# Patient Record
Sex: Male | Born: 1995 | Race: White | Hispanic: No | Marital: Single | Smoking: Never smoker
Health system: Southern US, Community
[De-identification: ages and names within clinical notes are randomized; demographics above are authoritative.]

## PROBLEM LIST (undated history)

## (undated) DIAGNOSIS — F840 Autistic disorder: Secondary | ICD-10-CM

## (undated) DIAGNOSIS — F79 Unspecified intellectual disabilities: Secondary | ICD-10-CM

## (undated) DIAGNOSIS — R569 Unspecified convulsions: Secondary | ICD-10-CM

## (undated) HISTORY — PX: TYMPANOSTOMY TUBE PLACEMENT: SHX32

## (undated) HISTORY — PX: TONSILLECTOMY: SUR1361

---
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
## 2015-02-18 ENCOUNTER — Emergency Department
Admission: EM | Admit: 2015-02-18 | Discharge: 2015-02-18 | Disposition: A | Discharge status: Home / Self Care | Payer: Medicaid Other | Attending: Emergency Medicine | Admitting: Emergency Medicine

## 2015-02-18 ENCOUNTER — Encounter: Payer: Self-pay | Admitting: Emergency Medicine

## 2015-02-18 DIAGNOSIS — F840 Autistic disorder: Secondary | ICD-10-CM | POA: Insufficient documentation

## 2015-02-18 DIAGNOSIS — Y9389 Activity, other specified: Secondary | ICD-10-CM | POA: Insufficient documentation

## 2015-02-18 DIAGNOSIS — R569 Unspecified convulsions: Secondary | ICD-10-CM | POA: Insufficient documentation

## 2015-02-18 DIAGNOSIS — Y9289 Other specified places as the place of occurrence of the external cause: Secondary | ICD-10-CM | POA: Insufficient documentation

## 2015-02-18 DIAGNOSIS — S01552A Open bite of oral cavity, initial encounter: Secondary | ICD-10-CM | POA: Insufficient documentation

## 2015-02-18 DIAGNOSIS — X58XXXA Exposure to other specified factors, initial encounter: Secondary | ICD-10-CM | POA: Insufficient documentation

## 2015-02-18 DIAGNOSIS — Y998 Other external cause status: Secondary | ICD-10-CM | POA: Diagnosis not present

## 2015-02-18 HISTORY — DX: Unspecified intellectual disabilities: F79

## 2015-02-18 HISTORY — DX: Unspecified convulsions: R56.9

## 2015-02-18 HISTORY — DX: Autistic disorder: F84.0

## 2015-02-18 LAB — COMPREHENSIVE METABOLIC PANEL
ALK PHOS: 67 U/L (ref 38–126)
ALT: 110 U/L — ABNORMAL HIGH (ref 17–63)
ANION GAP: 8 (ref 5–15)
AST: 48 U/L — ABNORMAL HIGH (ref 15–41)
Albumin: 4.7 g/dL (ref 3.5–5.0)
BUN: 19 mg/dL (ref 6–20)
CALCIUM: 9.6 mg/dL (ref 8.9–10.3)
CO2: 29 mmol/L (ref 22–32)
CREATININE: 0.78 mg/dL (ref 0.61–1.24)
Chloride: 101 mmol/L (ref 101–111)
GFR calc non Af Amer: >60 mL/min (ref >60)
GLUCOSE: 129 mg/dL — AB (ref 65–99)
Potassium: 3.9 mmol/L (ref 3.5–5.1)
SODIUM: 138 mmol/L (ref 135–145)
Total Bilirubin: 0.2 mg/dL — ABNORMAL LOW (ref 0.3–1.2)
Total Protein: 7.8 g/dL (ref 6.5–8.1)

## 2015-02-18 LAB — CBC WITH DIFFERENTIAL/PLATELET
Basophils Absolute: 0 10*3/uL (ref 0–0.1)
Basophils Relative: 0 %
EOS ABS: 0 10*3/uL (ref 0–0.7)
EOS PCT: 0 %
HCT: 44.9 % (ref 40.0–52.0)
HEMOGLOBIN: 15.5 g/dL (ref 13.0–18.0)
LYMPHS ABS: 1.2 10*3/uL (ref 1.0–3.6)
Lymphocytes Relative: 13 %
MCH: 29.9 pg (ref 26.0–34.0)
MCHC: 34.7 g/dL (ref 32.0–36.0)
MCV: 86.3 fL (ref 80.0–100.0)
MONO ABS: 0.5 10*3/uL (ref 0.2–1.0)
MONOS PCT: 6 %
NEUTROS PCT: 81 %
Neutro Abs: 7 10*3/uL — ABNORMAL HIGH (ref 1.4–6.5)
Platelets: 248 10*3/uL (ref 150–440)
RBC: 5.2 MIL/uL (ref 4.40–5.90)
RDW: 13.8 % (ref 11.5–14.5)
WBC: 8.8 10*3/uL (ref 3.8–10.6)

## 2015-02-18 MED ORDER — AZITHROMYCIN 250 MG PO TABS
ORAL_TABLET | ORAL | Status: AC
  Filled 2015-02-18: qty 4

## 2015-02-18 MED ORDER — SODIUM CHLORIDE 0.9 % IV BOLUS (SEPSIS)
1000.0000 mL | Freq: Once | INTRAVENOUS | Status: AC
  Administered 2015-02-18: 1000 mL via INTRAVENOUS

## 2015-02-18 MED ORDER — SODIUM CHLORIDE 0.9 % IV SOLN
1000.0000 mg | Freq: Once | INTRAVENOUS | Status: AC
  Administered 2015-02-18: 1000 mg via INTRAVENOUS
  Filled 2015-02-18: qty 10

## 2015-02-18 MED ORDER — LEVETIRACETAM 100 MG/ML PO SOLN: 750.0000 mg | Freq: Two times a day (BID) | ORAL | Status: AC

## 2015-02-18 MED ORDER — CHLORHEXIDINE GLUCONATE 0.12 % MT SOLN: 15.0000 mL | Freq: Two times a day (BID) | OROMUCOSAL | Status: AC

## 2015-02-18 MED ORDER — LEVETIRACETAM 750 MG PO TABS: 750.0000 mg | ORAL_TABLET | Freq: Two times a day (BID) | ORAL | Status: AC

## 2015-02-18 MED ORDER — LORAZEPAM 2 MG/ML IJ SOLN
2.0000 mg | Freq: Once | INTRAMUSCULAR | Status: AC
  Administered 2015-02-18: 2 mg via INTRAVENOUS

## 2015-02-18 MED ORDER — CEFTRIAXONE SODIUM 250 MG IJ SOLR
INTRAMUSCULAR | Status: AC
  Filled 2015-02-18: qty 250

## 2015-02-18 MED ORDER — LORAZEPAM 2 MG/ML IJ SOLN
INTRAMUSCULAR | Status: AC
  Administered 2015-02-18: 2 mg via INTRAVENOUS
  Filled 2015-02-18: qty 1

## 2015-02-18 NOTE — ED Notes (Signed)
Per dr. Alphonzo LemmingsMcshane request, attempted to ambulate pt.  Pt assisted to sitting position on bed and HR increased to 148.  Pt allowed to sit for a few minutes and HR did not decrease.  Pt not ambulated.  Dr. Alphonzo LemmingsMcshane informed.

## 2015-02-18 NOTE — ED Provider Notes (Addendum)
[facility] Emergency Department Provider Note  ____________________________________________   I have reviewed the triage vital signs and the nursing notes.   HISTORY  Chief Complaint Seizures    HPI Sean Morrow is a 19 y.o. male presents today after having had a witnessed seizure. Patient has a history of autism millimeters high functioning able to speak, moderate MR according to notes. 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 He was on phenobarbital approximately 5 years ago. The family has not followed up with neurology since that time. There is some limited means in the family. They cannot make it to [facility]. The patient has had 3 seizures since August including this one. They're grand mal seizures they have been witnessed that may have been others of the did not witness. The patient is able to tell me that he is not suffering from any pain right now and he feels okay. He is at his baseline per family. He is not taking any medication has no allergies to any medication and is not currently on any antiepileptic management.  Past Medical History  Diagnosis Date  . Mental retardation   . Autism   . Seizures (HCC)     There are no active problems to display for this patient.   Past Surgical History  Procedure Laterality Date  . Tonsillectomy    . Tympanostomy tube placement      No current outpatient prescriptions on file.  Allergies Citrus  History reviewed. No pertinent family history.  Social History Social History  Substance Use Topics  . Smoking status: Never Smoker   . Smokeless tobacco: Never Used  . Alcohol Use: No    Review of Systems, somewhat limited secondary to patient baseline mental status Constitutional: No fever/chills Eyes: No visual changes. ENT: No sore throat. No stiff neck  no neck pain Cardiovascular: Denies chest pain. Respiratory: Denies shortness of breath. Gastrointestinal:   no vomiting.  No diarrhea.  No constipation. Genitourinary: Negative for dysuria. Musculoskeletal: Negative lower extremity swelling Skin: Negative for rash. Neurological: Negative for headaches, focal weakness or numbness. 10-point ROS otherwise negative.  ____________________________________________   PHYSICAL EXAM:  VITAL SIGNS: ED Triage Vitals  Enc Vitals Group     BP 02/18/15 1603 148/75 mmHg     Pulse Rate 02/18/15 1603 99     Resp 02/18/15 1603 16     Temp 02/18/15 1603 97.9 F (36.6 C)     Temp Source 02/18/15 1603 Oral     SpO2 02/18/15 1601 95 %     Weight 02/18/15 1603 224 lb (101.606 kg)     Height 02/18/15 1603  (1.676 m)     Head Cir --      Peak Flow --      Pain Score --      Pain Loc --      Pain Edu? --      Excl. in GC? --     Constitutional: Alert and oriented to name and place unsure of the date has autism but will talk to me.. Well appearing and in no acute distress. Eyes: Conjunctivae are normal. PERRL. EOMI. Head: Atraumatic. Nose: No congestion/rhinnorhea. Mouth/Throat: Mucous membranes are moist.  Oropharynx non-erythematous. There is a well approximated bite to the right side of the tongue which will not require sutures Neck: No stridor.  Nontender with no meningismus Cardiovascular: Normal rate, regular rhythm. Grossly normal heart sounds.  Good peripheral circulation. Respiratory: Normal respiratory effort.  No retractions. Lungs CTAB. Abdominal: Soft and nontender. No distention. No guarding no rebound Back:  There is no focal tenderness or step off there is no midline tenderness there are no lesions noted. there is no CVA tenderness  Musculoskeletal: No lower extremity tenderness. No joint effusions, no DVT signs strong distal pulses no edema Neurologic:  Normal speech and language. No gross focal neurologic deficits are  appreciated.  Skin:  Skin is warm, dry and intact. No rash noted. Psychiatric: Mood and affect are normal. Speech and behavior are normal.  ____________________________________________   LABS (all labs ordered are listed, but only abnormal results are displayed)  Labs Reviewed  CBC WITH DIFFERENTIAL/PLATELET  COMPREHENSIVE METABOLIC PANEL   ____________________________________________  EKG  I personally interpreted any EKGs ordered by me or triage Sinus tachycardia rate 105 bpm no acute ST elevation or depression QTc 446 ____________________________________________  RADIOLOGY  I reviewed any imaging ordered by me or triage that were performed during my shift ____________________________________________   PROCEDURES  Procedure(s) performed: None  Critical Care performed: None  ____________________________________________   INITIAL IMPRESSION / ASSESSMENT AND PLAN / ED COURSE  Pertinent labs & imaging results that were available during my care of the patient were reviewed by me and considered in my medical decision making (see chart for details).  Patient had a grand mal seizure today. I did talk to Dr. Katrinka Blazing of neurology. He advises that we start the patient on Keppra, loading him at a gram IV and then starting at 750 twice a day. We have placed the patient under observation with seizure pads in the meantime. I will check basic blood work although I anticipate will not be illuminating. On-call neurology recommends the patient follow up in a few weeks with them in their clinic.  ----------------------------------------- 8:20 PM on 02/18/2015 -----------------------------------------  Patient had a witnessed tonic clonic seizure here, lasted for less than 2 minutes, this was before we were able to load him with Keppra. He has not been loaded with Keppra. He is resting comfortably. He did bite his tongue. There is no evidence of airway compromise. The patient is in no acute  distress, he does rouse and tell me his name this time. He did receive Ativan during seizure. We'll watch him until he is back to baseline and send him home with Her if we can. Had the patient seized after Keppra think it would be more likely that he'll benefit from admission but now that he is loaded, I think his likelihood of seizure should be greatly decreased. Mother has been advised that if he does have repeat seizures or he is deteriorating in any way he needs to return to the emergency department.   ----------------------------------------- 11:04 PM on 02/18/2015 -----------------------------------------  Patient did bite his tongue but it does not require repair. The patient is awake and alert, heart rate is in the 80s when he is calm, he is awake and oriented I do not think he requires admission. He had seizures without being on seizure medication after a long history of seizure disorder. I have discussed with neurology who will follow-up. I have given him a Low-dose he has been observed here in the emergency room for multiple hours. There was a few moments when his heart rate would go out the patient is very anxious sometimes his heart rate would go up when I talked  to him, he does have a history of autism however when he is resting calmly he is heart rate is in the 80s and he is not orthostatic has not dropped his pressure and I do not think that this is something contribute to his particular ailments. Given his autism I do think he would do better at home and the family agrees. We'll therefore discharge him with return precautions for seizure as well as a prescription for 750 twice a day of Keflex as indicated by neurology. ____________________________________________   FINAL CLINICAL IMPRESSION(S) / ED DIAGNOSES  Final diagnoses:  None     Jeanmarie PlantJames A Christifer Chapdelaine, MD 02/18/15 1616  Jeanmarie PlantJames A Natsha Guidry, MD 02/18/15 [id number]  Jeanmarie PlantJames A Rhylen Pulido, MD 02/18/15 [id number]  Jeanmarie PlantJames A Babbette Dalesandro, MD 02/18/15  [id number]

## 2015-02-18 NOTE — ED Notes (Signed)
Per pt's mother, pt keep removing [place].  Pt off [place] for several minutes, maintaining o2sat in mid to upper 90s.  Pt asleep.

## 2015-02-18 NOTE — ED Notes (Signed)
Pt began having a seizure at 1750, full tonic clonic seizure noted with patient biting his tongue. MD called to bedside. Seizure last approx 1.5 mins at this time.

## 2015-02-18 NOTE — ED Notes (Signed)
Per EMS pt had a witness seizure at school that was a full tonic-clonic. Pt has hx of seizures, autisms, and mild MR. EMS also states that at the school the teacher whose classroom he was in started 4-5 compressions when she believed him to be in cardiac arrest. Pt also presents with a bite to the R side of his tongue.

## 2015-02-18 NOTE — Discharge Instructions (Signed)
Seizure, Adult A seizure is abnormal electrical activity in the brain. Seizures usually last from 30 seconds to 2 minutes. There are various types of seizures. Before a seizure, you may have a warning sensation (aura) that a seizure is about to occur. An aura may include the following symptoms:   Fear or anxiety.  Nausea.  Feeling like the room is spinning (vertigo).  Vision changes, such as seeing flashing lights or spots. Common symptoms during a seizure include:  A change in attention or behavior (altered mental status).  Convulsions with rhythmic jerking movements.  Drooling.  Rapid eye movements.  Grunting.  Loss of bladder and bowel control.  Bitter taste in the mouth.  Tongue biting. After a seizure, you may feel confused and sleepy. You may also have an injury resulting from convulsions during the seizure. HOME CARE INSTRUCTIONS   If you are given medicines, take them exactly as prescribed by your health care provider.  Keep all follow-up appointments as directed by your health care provider.  Do not swim or drive or engage in risky activity during which a seizure could cause further injury to you or others until your health care provider says it is OK.  Get adequate rest.  Teach friends and family what to do if you have a seizure. They should:  Lay you on the ground to prevent a fall.  Put a cushion under your head.  Loosen any tight clothing around your neck.  Turn you on your side. If vomiting occurs, this helps keep your airway clear.  Stay with you until you recover.  Know whether or not you need emergency care. SEEK IMMEDIATE MEDICAL CARE IF:  The seizure lasts longer than 5 minutes.  The seizure is severe or you do not wake up immediately after the seizure.  You have an altered mental status after the seizure.  You are having more frequent or worsening seizures. Someone should drive you to the emergency department or call local emergency  services (911 in [place] MAKE SURE YOU:  Understand these instructions.  Will watch your condition.  Will get help right away if you are not doing well or get worse.   This information is not intended to replace advice given to you by your health care provider. Make sure you discuss any questions you have with your health care provider.   Document Released: 03/04/2000 Document Revised: 03/28/2014 Document Reviewed: 10/17/2012 Elsevier Interactive Patient Education 2016 [employer].  Epilepsy People with epilepsy have times when they shake and jerk uncontrollably (seizures). This happens when there is a sudden change in brain function. Epilepsy may have many possible causes. Anything that disturbs the normal pattern of brain cell activity can lead to seizures. HOME CARE   Follow your doctor's instructions about driving and safety during normal activities.  Get enough sleep.  Only take medicine as told by your doctor.  Avoid things that you know can cause you to have seizures (triggers).  Write down when your seizures happen and what you remember about each seizure. Write down anything you think may have caused the seizure to happen.  Tell the people you live and work with that you have seizures. Make sure they know how to help you. They should:  Cushion your head and body.  Turn you on your side.  Not restrain you.  Not place anything inside your mouth.  Call for local emergency medical help if there is any question about what has happened.  Keep all follow-up visits  with your doctor. This is very important. GET HELP IF:  You get an infection or start to feel sick. You may have more seizures when you are sick.  You are having seizures more often.  Your seizure pattern is changing. GET HELP RIGHT AWAY IF:   A seizure does not stop after a few seconds or minutes.  A seizure causes you to have trouble breathing.  A seizure gives you a very bad headache.  A seizure  makes you unable to speak or use a part of your body.   This information is not intended to replace advice given to you by your health care provider. Make sure you discuss any questions you have with your health care provider.   Document Released: 01/02/2009 Document Revised: 12/26/2012 Document Reviewed: 10/17/2012 Elsevier Interactive Patient Education [employer].

## 2015-02-18 NOTE — ED Notes (Signed)
Pt assisted to seated position, HR increased to 140.  After few minutes of sitting, HR decreased to 117.

## 2015-04-03 ENCOUNTER — Other Ambulatory Visit: Payer: Self-pay | Admitting: Neurology

## 2015-04-03 DIAGNOSIS — R569 Unspecified convulsions: Secondary | ICD-10-CM

## 2015-04-22 ENCOUNTER — Ambulatory Visit: Payer: Medicaid Other

## 2015-04-23 ENCOUNTER — Ambulatory Visit: Payer: Medicaid Other

## 2019-12-26 ENCOUNTER — Other Ambulatory Visit: Payer: Self-pay

## 2019-12-26 ENCOUNTER — Ambulatory Visit (LOCAL_COMMUNITY_HEALTH_CENTER): Payer: Medicaid Other

## 2019-12-26 DIAGNOSIS — Z23 Encounter for immunization: Secondary | ICD-10-CM

## 2019-12-26 NOTE — Progress Notes (Signed)
Per mother, client is autisiic and has epilepsy Mother states he takes medicine for epilepsy twice a day (every day). Reports last seizure was when client was in high school (graduated at age 24) and has been at least 3 years ago. Jossie Ng, RN
